# Patient Record
Sex: Male | Born: 1985 | Race: Black or African American | Hispanic: No | Marital: Single | Smoking: Former smoker
Health system: Southern US, Community
[De-identification: ages and names within clinical notes are randomized; demographics above are authoritative.]

## PROBLEM LIST (undated history)

## (undated) DIAGNOSIS — K219 Gastro-esophageal reflux disease without esophagitis: Secondary | ICD-10-CM

---

## 2015-12-06 ENCOUNTER — Encounter (HOSPITAL_BASED_OUTPATIENT_CLINIC_OR_DEPARTMENT_OTHER): Payer: Self-pay | Admitting: *Deleted

## 2015-12-06 ENCOUNTER — Emergency Department (HOSPITAL_BASED_OUTPATIENT_CLINIC_OR_DEPARTMENT_OTHER)
Admission: EM | Admit: 2015-12-06 | Discharge: 2015-12-06 | Disposition: A | Discharge status: Home / Self Care | Payer: Medicaid Other | Attending: Emergency Medicine | Admitting: Emergency Medicine

## 2015-12-06 DIAGNOSIS — J029 Acute pharyngitis, unspecified: Secondary | ICD-10-CM

## 2015-12-06 DIAGNOSIS — Z8719 Personal history of other diseases of the digestive system: Secondary | ICD-10-CM | POA: Diagnosis not present

## 2015-12-06 DIAGNOSIS — F17210 Nicotine dependence, cigarettes, uncomplicated: Secondary | ICD-10-CM | POA: Diagnosis not present

## 2015-12-06 HISTORY — DX: Gastro-esophageal reflux disease without esophagitis: K21.9

## 2015-12-06 LAB — RAPID STREP SCREEN (MED CTR MEBANE ONLY): Streptococcus, Group A Screen (Direct): NEGATIVE

## 2015-12-06 MED ORDER — BENZONATATE 100 MG PO CAPS: 100.0000 mg | ORAL_CAPSULE | Freq: Three times a day (TID) | ORAL | Status: DC | PRN

## 2015-12-06 MED ORDER — LORATADINE 10 MG PO TABS: 10.0000 mg | ORAL_TABLET | Freq: Every day | ORAL | Status: DC | PRN

## 2015-12-06 MED ORDER — IBUPROFEN 400 MG PO TABS
600.0000 mg | ORAL_TABLET | Freq: Once | ORAL | Status: AC
  Administered 2015-12-06: 600 mg via ORAL
  Filled 2015-12-06: qty 1

## 2015-12-06 NOTE — Discharge Instructions (Signed)

## 2015-12-06 NOTE — ED Notes (Signed)
Patient states he has a two day history of sore throat.  Cough started last night with white to yellow secretions. Denies fever.

## 2015-12-08 LAB — CULTURE, GROUP A STREP: Strep A Culture: NEGATIVE

## 2015-12-16 NOTE — ED Provider Notes (Signed)
CSN: [id number]     Arrival date & time 12/06/15  0807 History   First MD Initiated Contact with Patient 12/06/15 0830     Chief Complaint  Patient presents with  . Sore Throat     (Consider location/radiation/quality/duration/timing/severity/associated sxs/prior Treatment) HPI  29yM with sore throat. Onset 2 days ago. Persistent since. Cough. Productive. No fever or chills. No sob. Nor rash. No unusual leg pain or swelling. Has tried otc pain relievers with mild improvement.   Past Medical History  Diagnosis Date  . Acid reflux    History reviewed. No pertinent past surgical history. No family history on file. Social History  Substance Use Topics  . Smoking status: Current Some Day Smoker    Types: Cigars  . Smokeless tobacco: Never Used  . Alcohol Use: Yes     Comment: occassional    Review of Systems  All systems reviewed and negative, other than as noted in HPI.   Allergies  Review of patient's allergies indicates no known allergies.  Home Medications   Prior to Admission medications   Medication Sig Start Date End Date Taking? Authorizing Provider  benzonatate (TESSALON) 100 MG capsule Take 1 capsule (100 mg total) by mouth 3 (three) times daily as needed for cough. 12/06/15   Raeford RazorStephen Boston Cookson, MD  loratadine (CLARITIN) 10 MG tablet Take 1 tablet (10 mg total) by mouth daily as needed for allergies. 12/06/15   Raeford RazorStephen Kash Mothershead, MD   BP 122/85 mmHg  Pulse 75  Temp(Src) 98.5 F (36.9 C) (Oral)  Resp 20  SpO2 99% Physical Exam  Constitutional: He appears well-developed and well-nourished. No distress.  HENT:  Head: Normocephalic and atraumatic.  Mild non exudative pharyngitis. Uvula midline. Normal sounding voice. Neck supple.   Eyes: Conjunctivae are normal. Right eye exhibits no discharge. Left eye exhibits no discharge.  Neck: Neck supple.  Cardiovascular: Normal rate, regular rhythm and normal heart sounds.  Exam reveals no gallop and no friction rub.   No  murmur heard. Pulmonary/Chest: Effort normal and breath sounds normal. No respiratory distress.  Abdominal: Soft. He exhibits no distension. There is no tenderness.  Musculoskeletal: He exhibits no edema or tenderness.  Lower extremities symmetric as compared to each other. No calf tenderness. Negative Homan's. No palpable cords.   Neurological: He is alert.  Skin: Skin is warm and dry.  Psychiatric: He has a normal mood and affect. His behavior is normal. Thought content normal.  Nursing note and vitals reviewed.   ED Course  Procedures (including critical care time) Labs Review Labs Reviewed  RAPID STREP SCREEN (NOT AT [facility])  CULTURE, GROUP A STREP    Imaging Review No results found. I have personally reviewed and evaluated these images and lab results as part of my medical decision-making.   EKG Interpretation None      MDM   Final diagnoses:  Viral pharyngitis    29yM with sore throat. Likely viral illness. No evidence of airway compromise.  Clinically doubt strep. Plan symptomatic treatment.    Raeford RazorStephen Inis Borneman, MD 12/16/15 [phone]

## 2016-07-14 ENCOUNTER — Emergency Department (HOSPITAL_BASED_OUTPATIENT_CLINIC_OR_DEPARTMENT_OTHER): Payer: Medicaid Other

## 2016-07-14 ENCOUNTER — Emergency Department (HOSPITAL_BASED_OUTPATIENT_CLINIC_OR_DEPARTMENT_OTHER)
Admission: EM | Admit: 2016-07-14 | Discharge: 2016-07-14 | Disposition: A | Discharge status: Home / Self Care | Payer: Medicaid Other | Attending: Emergency Medicine | Admitting: Emergency Medicine

## 2016-07-14 ENCOUNTER — Encounter (HOSPITAL_BASED_OUTPATIENT_CLINIC_OR_DEPARTMENT_OTHER): Payer: Self-pay | Admitting: *Deleted

## 2016-07-14 DIAGNOSIS — W231XXA Caught, crushed, jammed, or pinched between stationary objects, initial encounter: Secondary | ICD-10-CM | POA: Diagnosis not present

## 2016-07-14 DIAGNOSIS — Y929 Unspecified place or not applicable: Secondary | ICD-10-CM | POA: Diagnosis not present

## 2016-07-14 DIAGNOSIS — M7989 Other specified soft tissue disorders: Secondary | ICD-10-CM | POA: Insufficient documentation

## 2016-07-14 DIAGNOSIS — M79644 Pain in right finger(s): Secondary | ICD-10-CM | POA: Diagnosis present

## 2016-07-14 DIAGNOSIS — Y999 Unspecified external cause status: Secondary | ICD-10-CM | POA: Insufficient documentation

## 2016-07-14 DIAGNOSIS — Y939 Activity, unspecified: Secondary | ICD-10-CM | POA: Diagnosis not present

## 2016-07-14 DIAGNOSIS — F17210 Nicotine dependence, cigarettes, uncomplicated: Secondary | ICD-10-CM | POA: Diagnosis not present

## 2016-07-14 DIAGNOSIS — S6991XA Unspecified injury of right wrist, hand and finger(s), initial encounter: Secondary | ICD-10-CM

## 2016-07-14 MED ORDER — IBUPROFEN 400 MG PO TABS
600.0000 mg | ORAL_TABLET | Freq: Once | ORAL | Status: AC | PRN
  Administered 2016-07-14: 600 mg via ORAL
  Filled 2016-07-14: qty 1

## 2016-07-14 MED ORDER — IBUPROFEN 800 MG PO TABS: 800.0000 mg | ORAL_TABLET | Freq: Three times a day (TID) | ORAL | 0 refills | Status: DC

## 2016-07-14 NOTE — ED Triage Notes (Signed)
Right hand index finger swelling since yesterday, unsure of injury

## 2016-07-14 NOTE — ED Provider Notes (Signed)
[facility]   CSN: [id number] Arrival date & time: 07/14/16  1645  First Provider Contact:  First MD Initiated Contact with Patient 07/14/16 2005        History   Chief Complaint Chief Complaint  Patient presents with  . Finger Injury    HPI Edgar Arellano is a 30 y.o. male.  HPI Edgar Arellano is a 30 y.o. male with PMH significant for acid reflux who presents with 3 months of sudden onset, intermittent, mild-to-moderate right Ring finger pain after he jammed it. Patient reports it will intermittently become more painful and will swell. He states it flared up on him last night. No prior treatments. Aggravating factors include movement. No modifying factors. He denies any color changes, fever, chills on and numbness, or weakness.  Past Medical History:  Diagnosis Date  . Acid reflux     There are no active problems to display for this patient.   History reviewed. No pertinent surgical history.     Home Medications    Prior to Admission medications   Medication Sig Start Date End Date Taking? Authorizing Provider  benzonatate (TESSALON) 100 MG capsule Take 1 capsule (100 mg total) by mouth 3 (three) times daily as needed for cough. 12/06/15   Raeford Razor, MD  ibuprofen (ADVIL,MOTRIN) 800 MG tablet Take 1 tablet (800 mg total) by mouth 3 (three) times daily. 07/14/16   Cheri Fowler, PA-C  loratadine (CLARITIN) 10 MG tablet Take 1 tablet (10 mg total) by mouth daily as needed for allergies. 12/06/15   Raeford Razor, MD    Family History No family history on file.  Social History Social History  Substance Use Topics  . Smoking status: Current Some Day Smoker    Types: Cigars  . Smokeless tobacco: Never Used  . Alcohol use Yes     Comment: occassional     Allergies   Review of patient's allergies indicates no known allergies.   Review of Systems Review of Systems All other systems negative unless otherwise stated in  HPI   Physical Exam Updated Vital Signs BP 132/88 (BP Location: Left Arm)   Pulse 77   Temp 98.6 F (37 C) (Oral)   Resp 16   Ht 5\' 5"  (1.651 m)   Wt 60.8 kg   SpO2 97%   BMI 22.30 kg/m   Physical Exam  Constitutional: He is oriented to person, place, and time. He appears well-developed and well-nourished.  HENT:  Head: Normocephalic and atraumatic.  Eyes: Conjunctivae are normal.  Neck: Normal range of motion.  Cardiovascular:  Pulses:      Radial pulses are 2+ on the right side, and 2+ on the left side.  Brisk capillary refill.  Pulmonary/Chest: Effort normal. No respiratory distress.  Abdominal: He exhibits no distension.  Musculoskeletal: He exhibits edema and tenderness.  Mild-to-moderate swelling over the right fourth PIPJ with tenderness. Slightly decreased range of motion of this joint with flexion due to pain.  Neurological: He is alert and oriented to person, place, and time.  Strength and sensation intact in all digits.  Skin: Skin is warm and dry. Capillary refill takes less than 2 seconds. No erythema.  No erythema, warmth, induration, or fluctuance.     ED Treatments / Results  Labs (all labs ordered are listed, but only abnormal results are displayed) Labs Reviewed - No data to display  EKG  EKG Interpretation None       Radiology Dg Finger Ring Right  Result  Date: 07/14/2016 CLINICAL DATA:  Pain after trauma 3 months ago. EXAM: RIGHT RING FINGER 2+V COMPARISON:  None. FINDINGS: There is no evidence of fracture or dislocation. There is no evidence of arthropathy or other focal bone abnormality. Soft tissues are unremarkable. IMPRESSION: Negative. Electronically Signed   By: Gerome Sam III M.D   On: 07/14/2016 17:58    Procedures Procedures (including critical care time)  Medications Ordered in ED Medications  ibuprofen (ADVIL,MOTRIN) tablet 600 mg (600 mg Oral Given 07/14/16 2017)     Initial Impression / Assessment and Plan / ED Course   I have reviewed the triage vital signs and the nursing notes.  Pertinent labs & imaging results that were available during my care of the patient were reviewed by me and considered in my medical decision making (see chart for details).  Clinical Course   Patient presents with intermittent right Ring finger pain after jamming it. He is neurovascularly intact with brisk capillary refill. No infectious signs.  Plain films are negative for fracture. Patient placed in finger splint. Home with ibuprofen. Recommend ice. Follow up hand surgery. Return precautions discussed. Patient agrees and acknowledges the above plan for discharge.   Final Clinical Impressions(s) / ED Diagnoses   Final diagnoses:  Jammed finger (interphalangeal joint), right, initial encounter    New Prescriptions New Prescriptions   IBUPROFEN (ADVIL,MOTRIN) 800 MG TABLET    Take 1 tablet (800 mg total) by mouth 3 (three) times daily.     Cheri Fowler, PA-C 07/14/16 2035    Loren Racer, MD 07/17/16 2242

## 2016-08-28 ENCOUNTER — Other Ambulatory Visit: Payer: Self-pay | Admitting: Orthopaedic Surgery

## 2016-08-28 DIAGNOSIS — M79641 Pain in right hand: Secondary | ICD-10-CM

## 2016-09-05 ENCOUNTER — Ambulatory Visit
Admission: RE | Admit: 2016-09-05 | Discharge: 2016-09-05 | Disposition: A | Discharge status: Home / Self Care | Payer: Medicaid Other | Source: Ambulatory Visit | Attending: Orthopaedic Surgery | Admitting: Orthopaedic Surgery

## 2016-09-05 DIAGNOSIS — M79641 Pain in right hand: Secondary | ICD-10-CM

## 2016-09-13 ENCOUNTER — Ambulatory Visit (INDEPENDENT_AMBULATORY_CARE_PROVIDER_SITE_OTHER): Payer: Medicaid Other | Admitting: Orthopaedic Surgery

## 2016-09-13 DIAGNOSIS — S63634D Sprain of interphalangeal joint of right ring finger, subsequent encounter: Secondary | ICD-10-CM

## 2016-10-15 ENCOUNTER — Other Ambulatory Visit (INDEPENDENT_AMBULATORY_CARE_PROVIDER_SITE_OTHER): Payer: Self-pay | Admitting: Orthopaedic Surgery

## 2016-10-15 ENCOUNTER — Ambulatory Visit (INDEPENDENT_AMBULATORY_CARE_PROVIDER_SITE_OTHER): Payer: Medicaid Other | Admitting: Orthopaedic Surgery

## 2016-10-15 DIAGNOSIS — M79644 Pain in right finger(s): Secondary | ICD-10-CM | POA: Diagnosis not present

## 2016-10-15 MED ORDER — METHYLPREDNISOLONE 4 MG PO TBPK: ORAL_TABLET | ORAL | 0 refills | Status: DC

## 2016-10-15 MED ORDER — METHYLPREDNISOLONE 4 MG PO TABS: ORAL_TABLET | ORAL | 0 refills | Status: DC

## 2016-10-15 MED ORDER — METHYLPREDNISOLONE 4 MG PO TABS: 4.0000 mg | ORAL_TABLET | Freq: Every day | ORAL | 0 refills | Status: DC

## 2016-10-15 MED ORDER — DICLOFENAC SODIUM 1 % TD GEL: 2.0000 g | Freq: Three times a day (TID) | TRANSDERMAL | Status: DC | PRN

## 2016-10-15 MED ORDER — DICLOFENAC SODIUM 1 % TD GEL: 2.0000 g | Freq: Four times a day (QID) | TRANSDERMAL | 3 refills | Status: DC

## 2016-10-15 NOTE — Progress Notes (Signed)
Mr. 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 We obtained an MRI of his right hand that showed no ligamentous disruption or fracture of the ring finger. This is been going on since an accident earlier this year. He sustained trauma to this finger does not minimally get better. I did provide an injection around the flexor tendons at his last visit a month ago. He said that did help some but he still has some pain.  On examination of his right hand and his swelling is dramatically decreased he has full range of motion of his MCP and IP joints of his fingers including the right ring finger but is deathly still painful.  At this point no other thing that I would try as one more oral steroid taper as well as Voltaren gel. Follow up as needed.

## 2017-03-11 ENCOUNTER — Encounter (HOSPITAL_BASED_OUTPATIENT_CLINIC_OR_DEPARTMENT_OTHER): Payer: Self-pay

## 2017-03-11 ENCOUNTER — Emergency Department (HOSPITAL_BASED_OUTPATIENT_CLINIC_OR_DEPARTMENT_OTHER)
Admission: EM | Admit: 2017-03-11 | Discharge: 2017-03-11 | Disposition: A | Discharge status: Home / Self Care | Payer: Medicaid Other | Attending: Emergency Medicine | Admitting: Emergency Medicine

## 2017-03-11 DIAGNOSIS — F17290 Nicotine dependence, other tobacco product, uncomplicated: Secondary | ICD-10-CM | POA: Insufficient documentation

## 2017-03-11 DIAGNOSIS — K120 Recurrent oral aphthae: Secondary | ICD-10-CM | POA: Insufficient documentation

## 2017-03-11 DIAGNOSIS — K1379 Other lesions of oral mucosa: Secondary | ICD-10-CM | POA: Diagnosis present

## 2017-03-11 MED ORDER — IBUPROFEN 400 MG PO TABS
600.0000 mg | ORAL_TABLET | Freq: Once | ORAL | Status: AC
  Administered 2017-03-11: 600 mg via ORAL
  Filled 2017-03-11: qty 1

## 2017-03-11 NOTE — ED Notes (Signed)
Pain to roof of mouth, reddened area noted and is "breaking out" to face and not sure why.

## 2017-03-11 NOTE — ED Triage Notes (Signed)
c/o woke with pain to mouth x today-denies injury-NAD-steady gait

## 2017-03-11 NOTE — Discharge Instructions (Signed)
Please read instructions below. You can gargle with warm salt water to help symptoms. You can take Advil or Tylenol for pain. Return to ER for inability to swallow liquids, sore throat, fever, new or worsening symptoms.

## 2017-03-11 NOTE — ED Provider Notes (Signed)
[facility]   CSN: [id number] Arrival date & time: 03/11/17  1533   By signing my name below, I, Talbert Nan, attest that this documentation has been prepared under the direction and in the presence of [place] Russo, PA-C. Electronically Signed: Talbert Nan, Scribe. 03/11/17. 4:49 PM.    History   Chief Complaint Chief Complaint  Patient presents with  . Oral Pain    HPI Edgar Arellano is a 31 y.o. male who presents to the Emergency Department complaining of a sore in his mouth that began this morning. Denies recent temperature hot food, but reports eating hot sauce and pork skin last night which is reportedly a "tough" food. Denies viral sx, F/C, nasal congestion. He is able to swallow liquids without difficulty, though it is painful. States he gargled salt water once PTA without relief.     The history is provided by the patient. No language interpreter was used.    Past Medical History:  Diagnosis Date  . Acid reflux     There are no active problems to display for this patient.   History reviewed. No pertinent surgical history.     Home Medications    Prior to Admission medications   Not on File    Family History No family history on file.  Social History Social History  Substance Use Topics  . Smoking status: Current Some Day Smoker    Types: Cigars  . Smokeless tobacco: Never Used  . Alcohol use Yes     Comment: weekly     Allergies   Patient has no known allergies.   Review of Systems Review of Systems  Constitutional: Negative for chills and fever.  HENT: Positive for mouth sores. Negative for congestion, dental problem, sinus pressure, sore throat and trouble swallowing.   Respiratory: Negative for cough.   Musculoskeletal: Negative for neck pain.  Skin:       Dry skin on face  Hematological: Negative for adenopathy.  All other systems reviewed and are negative.    Physical Exam Updated Vital Signs BP  136/85 (BP Location: Left Arm)   Pulse 64   Temp 98.2 F (36.8 C) (Oral)   Resp 16   Ht  (1.651 m)   Wt 63 kg   SpO2 100%   BMI 23.13 kg/m   Physical Exam  Constitutional: He is oriented to person, place, and time. He appears well-developed and well-nourished.  HENT:  Head: Normocephalic and atraumatic.  Mouth/Throat: Uvula is midline and mucous membranes are normal. Oral lesions (<69mm white lesion w surrounding erythema on soft L soft palate) present. No trismus in the jaw. No dental abscesses or uvula swelling. No oropharyngeal exudate, posterior oropharyngeal erythema or tonsillar abscesses. No tonsillar exudate.    Eyes: Conjunctivae are normal.  Neck: Normal range of motion. Neck supple.  Cardiovascular: Normal rate.   Pulmonary/Chest: Effort normal. No respiratory distress.  Musculoskeletal: Normal range of motion.  Lymphadenopathy:    He has no cervical adenopathy.  Neurological: He is alert and oriented to person, place, and time.  Skin: Skin is warm and dry. No rash noted.  Psychiatric: He has a normal mood and affect. His behavior is normal.  Nursing note and vitals reviewed.    ED Treatments / Results   DIAGNOSTIC STUDIES: Oxygen Saturation is 100% on room air, normal by my interpretation.    COORDINATION OF CARE: 4:40 PM Discussed treatment plan with pt at bedside and pt agreed to plan, which includes  Tylenol or Advil for pain, salt water to gargle. If his pain increases or he begins to not be able to swallow he needs to come back to the ED.    Labs (all labs ordered are listed, but only abnormal results are displayed) Labs Reviewed - No data to display  EKG  EKG Interpretation None       Radiology No results found.  Procedures Procedures (including critical care time)  Medications Ordered in ED Medications  ibuprofen (ADVIL,MOTRIN) tablet 600 mg (600 mg Oral Given 03/11/17 1706)     Initial Impression / Assessment and Plan / ED Course    I have reviewed the triage vital signs and the nursing notes.  Pertinent labs & imaging results that were available during my care of the patient were reviewed by me and considered in my medical decision making (see chart for details).     Pt w likely apthous ulcer on soft palate, initial occurrence. No viral sx, able to swallow liquids w/o difficulty, uvula midline, no trismus, afebrile, no adenopathy. Apthous ulcer vs oral HSV vs mucosal abrasion. Pt safe for discharge. Warm salt water gargle, advil or tylenol for pain, avoid spicy foods.   Discussed results, findings, treatment and follow up. Patient advised of return precautions. Patient verbalized understanding and agreed with plan.   Final Clinical Impressions(s) / ED Diagnoses   Final diagnoses:  Oral aphthous ulcer    New Prescriptions There are no discharge medications for this patient.  I personally performed the services described in this documentation, which was scribed in my presence. The recorded information has been reviewed and is accurate.     [place] N Russo, PA-C 03/12/17 2440    Lyndal Pulley, MD 03/12/17 [phone]

## 2017-07-28 ENCOUNTER — Emergency Department (HOSPITAL_BASED_OUTPATIENT_CLINIC_OR_DEPARTMENT_OTHER)
Admission: EM | Admit: 2017-07-28 | Discharge: 2017-07-29 | Disposition: A | Discharge status: Home / Self Care | Payer: Medicaid Other | Attending: Emergency Medicine | Admitting: Emergency Medicine

## 2017-07-28 ENCOUNTER — Emergency Department (HOSPITAL_BASED_OUTPATIENT_CLINIC_OR_DEPARTMENT_OTHER): Payer: Medicaid Other

## 2017-07-28 ENCOUNTER — Encounter (HOSPITAL_BASED_OUTPATIENT_CLINIC_OR_DEPARTMENT_OTHER): Payer: Self-pay | Admitting: Emergency Medicine

## 2017-07-28 DIAGNOSIS — K5900 Constipation, unspecified: Secondary | ICD-10-CM

## 2017-07-28 DIAGNOSIS — F17210 Nicotine dependence, cigarettes, uncomplicated: Secondary | ICD-10-CM | POA: Insufficient documentation

## 2017-07-28 DIAGNOSIS — R1011 Right upper quadrant pain: Secondary | ICD-10-CM

## 2017-07-28 DIAGNOSIS — K649 Unspecified hemorrhoids: Secondary | ICD-10-CM | POA: Diagnosis not present

## 2017-07-28 DIAGNOSIS — R112 Nausea with vomiting, unspecified: Secondary | ICD-10-CM | POA: Diagnosis not present

## 2017-07-28 LAB — CBC WITH DIFFERENTIAL/PLATELET
BASOS ABS: 0.1 10*3/uL (ref 0.0–0.1)
BASOS PCT: 1 %
EOS PCT: 5 %
Eosinophils Absolute: 0.3 10*3/uL (ref 0.0–0.7)
HCT: 41 % (ref 39.0–52.0)
Hemoglobin: 14.4 g/dL (ref 13.0–17.0)
Lymphocytes Relative: 46 %
Lymphs Abs: 2.6 10*3/uL (ref 0.7–4.0)
MCH: 28.9 pg (ref 26.0–34.0)
MCHC: 35.1 g/dL (ref 30.0–36.0)
MCV: 82.2 fL (ref 78.0–100.0)
Monocytes Absolute: 0.6 10*3/uL (ref 0.1–1.0)
Monocytes Relative: 10 %
NEUTROS PCT: 38 %
Neutro Abs: 2.2 10*3/uL (ref 1.7–7.7)
Platelets: 301 10*3/uL (ref 150–400)
RBC: 4.99 MIL/uL (ref 4.22–5.81)
RDW: 13.7 % (ref 11.5–15.5)
WBC: 5.7 10*3/uL (ref 4.0–10.5)

## 2017-07-28 LAB — COMPREHENSIVE METABOLIC PANEL
ALT: 22 U/L (ref 17–63)
AST: 30 U/L (ref 15–41)
Albumin: 4.4 g/dL (ref 3.5–5.0)
Alkaline Phosphatase: 55 U/L (ref 38–126)
Anion gap: 10 (ref 5–15)
BILIRUBIN TOTAL: 0.6 mg/dL (ref 0.3–1.2)
BUN: 18 mg/dL (ref 6–20)
CO2: 25 mmol/L (ref 22–32)
Calcium: 9.6 mg/dL (ref 8.9–10.3)
Chloride: 104 mmol/L (ref 101–111)
Creatinine, Ser: 1.14 mg/dL (ref 0.61–1.24)
Glucose, Bld: 102 mg/dL — ABNORMAL HIGH (ref 65–99)
POTASSIUM: 3.5 mmol/L (ref 3.5–5.1)
Sodium: 139 mmol/L (ref 135–145)
Total Protein: 7.4 g/dL (ref 6.5–8.1)

## 2017-07-28 LAB — LIPASE, BLOOD: Lipase: 57 U/L — ABNORMAL HIGH (ref 11–51)

## 2017-07-28 MED ORDER — MORPHINE SULFATE (PF) 4 MG/ML IV SOLN
4.0000 mg | Freq: Once | INTRAVENOUS | Status: AC
  Administered 2017-07-28: 4 mg via INTRAVENOUS
  Filled 2017-07-28: qty 1

## 2017-07-28 MED ORDER — ONDANSETRON HCL 4 MG/2ML IJ SOLN
4.0000 mg | Freq: Once | INTRAMUSCULAR | Status: AC
  Administered 2017-07-28: 4 mg via INTRAVENOUS
  Filled 2017-07-28: qty 2

## 2017-07-28 NOTE — ED Triage Notes (Signed)
Patient is having right upper quadrant pain all day with Nausea. Reports that he vomited x 1 prior to arrival. Patient also reports Hemorid problems

## 2017-07-28 NOTE — ED Provider Notes (Signed)
[facility]   CSN: [id number] Arrival date & time: 07/28/17  2136  By signing my name below, I, Diona Browner, attest that this documentation has been prepared under the direction and in the presence of Fredricka Kohrs, Canary Brim, MD. Electronically Signed: Diona Browner, ED Scribe. 07/28/17. 11:03 PM.  History   Chief Complaint Chief Complaint  Patient presents with  . Abdominal Pain    HPI Edgar Arellano is a 31 y.o. male who presents to the Emergency Department complaining of constant RUQ pain that started this evening. Associated sx include nausea, vomiting, and constipation. Pt also reports hemorrhoids and has noticed BRB on the toilet paper. He has been constipated for the last week. Pt had medication for his constipation, but ran out of it. He hasn't tried anything else to alleviate his sx. Pt denies appetite changes, fever, or any other complaints at this time.   The history is provided by the patient. No language interpreter was used.    Past Medical History:  Diagnosis Date  . Acid reflux     There are no active problems to display for this patient.   History reviewed. No pertinent surgical history.     Home Medications    Prior to Admission medications   Medication Sig Start Date End Date Taking? Authorizing Provider  docusate sodium (COLACE) 100 MG capsule Take 1 capsule (100 mg total) by mouth every 12 (twelve) hours. 07/29/17   Gilda Crease, MD  hydrocortisone (ANUSOL-HC) 25 MG suppository Place 1 suppository (25 mg total) rectally 2 (two) times daily. 07/29/17   Gilda Crease, MD  magnesium citrate SOLN Take 296 mLs (1 Bottle total) by mouth once. 07/29/17 07/29/17  Gilda Crease, MD  polyethylene glycol (MIRALAX / GLYCOLAX) packet Take 17 g by mouth daily as needed for moderate constipation. 07/29/17   Gilda Crease, MD  ranitidine (ZANTAC) 150 MG tablet Take 1 tablet (150 mg total) by mouth 2  (two) times daily. 07/29/17   Gilda Crease, MD    Family History History reviewed. No pertinent family history.  Social History Social History  Substance Use Topics  . Smoking status: Current Some Day Smoker    Types: Cigars  . Smokeless tobacco: Never Used  . Alcohol use Yes     Comment: weekly     Allergies   Patient has no known allergies.   Review of Systems Review of Systems  Constitutional: Negative for appetite change and fever.  Gastrointestinal: Positive for abdominal pain, constipation, nausea and vomiting.  All other systems reviewed and are negative.    Physical Exam Updated Vital Signs BP (!) 139/95   Pulse 74   Temp 98.6 F (37 C) (Oral)   Resp 15   Ht 5\' 5"  (1.651 m)   Wt 56.7 kg (125 lb)   SpO2 99%   BMI 20.80 kg/m   Physical Exam  Constitutional: He is oriented to person, place, and time. He appears well-developed and well-nourished. No distress.  HENT:  Head: Normocephalic and atraumatic.  Right Ear: Hearing normal.  Left Ear: Hearing normal.  Nose: Nose normal.  Mouth/Throat: Oropharynx is clear and moist and mucous membranes are normal.  Eyes: Pupils are equal, round, and reactive to light. Conjunctivae and EOM are normal.  Neck: Normal range of motion. Neck supple.  Cardiovascular: Regular rhythm, S1 normal and S2 normal.  Exam reveals no gallop and no friction rub.   No murmur heard. Pulmonary/Chest: Effort normal and breath sounds  normal. No respiratory distress. He exhibits no tenderness.  Abdominal: Soft. Normal appearance and bowel sounds are normal. There is no hepatosplenomegaly. There is tenderness in the right upper quadrant. There is no rebound, no guarding, no tenderness at McBurney's point and negative Murphy's sign. No hernia.  Musculoskeletal: Normal range of motion.  Neurological: He is alert and oriented to person, place, and time. He has normal strength. No cranial nerve deficit or sensory deficit. Coordination  normal. GCS eye subscore is 4. GCS verbal subscore is 5. GCS motor subscore is 6.  Skin: Skin is warm, dry and intact. No rash noted. No cyanosis.  Psychiatric: He has a normal mood and affect. His speech is normal and behavior is normal. Thought content normal.  Nursing note and vitals reviewed.    ED Treatments / Results  DIAGNOSTIC STUDIES: Oxygen Saturation is 100% on RA, normal by my interpretation.   COORDINATION OF CARE: 11:03 PM-Discussed next steps with pt. Pt verbalized understanding and is agreeable with the plan.   Labs (all labs ordered are listed, but only abnormal results are displayed) Labs Reviewed  COMPREHENSIVE METABOLIC PANEL - Abnormal; Notable for the following:       Result Value   Glucose, Bld 102 (*)    All other components within normal limits  LIPASE, BLOOD - Abnormal; Notable for the following:    Lipase 57 (*)    All other components within normal limits  CBC WITH DIFFERENTIAL/PLATELET    EKG  EKG Interpretation None       Radiology Dg Abd Acute W/chest  Result Date: 07/28/2017 CLINICAL DATA:  Right-sided abdominal pain.  Vomiting. EXAM: DG ABDOMEN ACUTE W/ 1V CHEST COMPARISON:  None. FINDINGS: There is no evidence of dilated bowel loops or free intraperitoneal air. No radiopaque calculi or other significant radiographic abnormality is seen. Heart size and mediastinal contours are within normal limits. Both lungs are clear. IMPRESSION: Negative abdominal radiographs.  No acute cardiopulmonary disease. Electronically Signed   By: Ted Mcalpine M.D.   On: 07/28/2017 23:40    Procedures Procedures (including critical care time)  Medications Ordered in ED Medications  morphine 4 MG/ML injection 4 mg (4 mg Intravenous Given 07/28/17 2334)  ondansetron (ZOFRAN) injection 4 mg (4 mg Intravenous Given 07/28/17 2334)     Initial Impression / Assessment and Plan / ED Course  I have reviewed the triage vital signs and the nursing  notes.  Pertinent labs & imaging results that were available during my care of the patient were reviewed by me and considered in my medical decision making (see chart for details).     Patient presents to the emergency department for evaluation of abdominal pain. Patient complaining of persistent abdominal discomfort, does not have any signs of peritonitis on examination. He is afebrile. Vital signs are normal. White blood cell count is 5.7. Blood work is unremarkable. Patient reports a history of acid reflux and constipation. He has been feeling increased constipation and experiencing hemorrhoids this week. X-ray does show increased stool burden, predominantly in the right upper quadrant that explains his symptoms. Does not require any additional imaging. Will treat for hemorrhoids, constipation and reflux, follow-up with gastroenterology.  Final Clinical Impressions(s) / ED Diagnoses   Final diagnoses:  Right upper quadrant abdominal pain  Constipation, unspecified constipation type  Hemorrhoids, unspecified hemorrhoid type    New Prescriptions New Prescriptions   DOCUSATE SODIUM (COLACE) 100 MG CAPSULE    Take 1 capsule (100 mg total) by mouth every 12 (  twelve) hours.   HYDROCORTISONE (ANUSOL-HC) 25 MG SUPPOSITORY    Place 1 suppository (25 mg total) rectally 2 (two) times daily.   MAGNESIUM CITRATE SOLN    Take 296 mLs (1 Bottle total) by mouth once.   POLYETHYLENE GLYCOL (MIRALAX / GLYCOLAX) PACKET    Take 17 g by mouth daily as needed for moderate constipation.   RANITIDINE (ZANTAC) 150 MG TABLET    Take 1 tablet (150 mg total) by mouth 2 (two) times daily.   I personally performed the services described in this documentation, which was scribed in my presence. The recorded information has been reviewed and is accurate.'     Gilda Crease, MD 07/29/17 0025

## 2017-07-28 NOTE — ED Notes (Signed)
Alert, NAD, calm, interactive, resps e/u, speaking in clear complete sentences, no dyspnea noted, skin W&D, VSS, c/o abd pain and NVD, constipation, (denies: sob, dizziness or visual changes), EDP into room. Family at [facility].

## 2017-07-29 MED ORDER — POLYETHYLENE GLYCOL 3350 17 G PO PACK: 17.0000 g | PACK | Freq: Every day | ORAL | 0 refills | Status: AC | PRN

## 2017-07-29 MED ORDER — DOCUSATE SODIUM 100 MG PO CAPS: 100.0000 mg | ORAL_CAPSULE | Freq: Two times a day (BID) | ORAL | 0 refills | Status: AC

## 2017-07-29 MED ORDER — RANITIDINE HCL 150 MG PO TABS: 150.0000 mg | ORAL_TABLET | Freq: Two times a day (BID) | ORAL | 0 refills | Status: AC

## 2017-07-29 MED ORDER — MAGNESIUM CITRATE PO SOLN: 1.0000 | Freq: Once | ORAL | 0 refills | Status: AC

## 2017-07-29 MED ORDER — ONDANSETRON HCL 4 MG/2ML IJ SOLN
4.0000 mg | Freq: Once | INTRAMUSCULAR | Status: AC
  Administered 2017-07-29: 4 mg via INTRAVENOUS

## 2017-07-29 MED ORDER — ONDANSETRON HCL 4 MG/2ML IJ SOLN
INTRAMUSCULAR | Status: AC
  Administered 2017-07-29: 4 mg via INTRAVENOUS
  Filled 2017-07-29: qty 2

## 2017-07-29 MED ORDER — HYDROCORTISONE ACETATE 25 MG RE SUPP: 25.0000 mg | Freq: Two times a day (BID) | RECTAL | 0 refills | Status: AC

## 2017-12-27 ENCOUNTER — Emergency Department (HOSPITAL_BASED_OUTPATIENT_CLINIC_OR_DEPARTMENT_OTHER): Payer: Medicaid Other

## 2017-12-27 ENCOUNTER — Encounter (HOSPITAL_BASED_OUTPATIENT_CLINIC_OR_DEPARTMENT_OTHER): Payer: Self-pay | Admitting: Emergency Medicine

## 2017-12-27 ENCOUNTER — Emergency Department (HOSPITAL_BASED_OUTPATIENT_CLINIC_OR_DEPARTMENT_OTHER)
Admission: EM | Admit: 2017-12-27 | Discharge: 2017-12-27 | Disposition: A | Discharge status: Home / Self Care | Payer: Medicaid Other | Attending: Emergency Medicine | Admitting: Emergency Medicine

## 2017-12-27 ENCOUNTER — Other Ambulatory Visit: Payer: Self-pay

## 2017-12-27 DIAGNOSIS — R112 Nausea with vomiting, unspecified: Secondary | ICD-10-CM

## 2017-12-27 DIAGNOSIS — Z79899 Other long term (current) drug therapy: Secondary | ICD-10-CM | POA: Diagnosis not present

## 2017-12-27 DIAGNOSIS — R197 Diarrhea, unspecified: Secondary | ICD-10-CM

## 2017-12-27 DIAGNOSIS — Z87891 Personal history of nicotine dependence: Secondary | ICD-10-CM | POA: Insufficient documentation

## 2017-12-27 DIAGNOSIS — E860 Dehydration: Secondary | ICD-10-CM | POA: Diagnosis not present

## 2017-12-27 LAB — COMPREHENSIVE METABOLIC PANEL
ALBUMIN: 4.2 g/dL (ref 3.5–5.0)
ALK PHOS: 58 U/L (ref 38–126)
ALT: 16 U/L — ABNORMAL LOW (ref 17–63)
ANION GAP: 11 (ref 5–15)
AST: 28 U/L (ref 15–41)
BILIRUBIN TOTAL: 0.6 mg/dL (ref 0.3–1.2)
BUN: 17 mg/dL (ref 6–20)
CALCIUM: 9.1 mg/dL (ref 8.9–10.3)
CO2: 25 mmol/L (ref 22–32)
Chloride: 103 mmol/L (ref 101–111)
Creatinine, Ser: 1.62 mg/dL — ABNORMAL HIGH (ref 0.61–1.24)
GFR, EST NON AFRICAN AMERICAN: 55 mL/min — AB (ref >60)
GLUCOSE: 95 mg/dL (ref 65–99)
Potassium: 3.9 mmol/L (ref 3.5–5.1)
Sodium: 139 mmol/L (ref 135–145)
TOTAL PROTEIN: 8.2 g/dL — AB (ref 6.5–8.1)

## 2017-12-27 LAB — CBC
HEMATOCRIT: 45.7 % (ref 39.0–52.0)
HEMOGLOBIN: 15.8 g/dL (ref 13.0–17.0)
MCH: 28.5 pg (ref 26.0–34.0)
MCHC: 34.6 g/dL (ref 30.0–36.0)
MCV: 82.3 fL (ref 78.0–100.0)
Platelets: 231 10*3/uL (ref 150–400)
RBC: 5.55 MIL/uL (ref 4.22–5.81)
RDW: 13.6 % (ref 11.5–15.5)
WBC: 4.4 10*3/uL (ref 4.0–10.5)

## 2017-12-27 LAB — INFLUENZA PANEL BY PCR (TYPE A & B)
Influenza A By PCR: POSITIVE — AB
Influenza B By PCR: NEGATIVE

## 2017-12-27 LAB — LIPASE, BLOOD: LIPASE: 56 U/L — AB (ref 11–51)

## 2017-12-27 MED ORDER — KETOROLAC TROMETHAMINE 30 MG/ML IJ SOLN
30.0000 mg | Freq: Once | INTRAMUSCULAR | Status: AC
  Administered 2017-12-27: 30 mg via INTRAVENOUS
  Filled 2017-12-27: qty 1

## 2017-12-27 MED ORDER — ONDANSETRON 8 MG PO TBDP: 8.0000 mg | ORAL_TABLET | Freq: Three times a day (TID) | ORAL | 0 refills | Status: AC | PRN

## 2017-12-27 MED ORDER — ONDANSETRON HCL 4 MG/2ML IJ SOLN
4.0000 mg | Freq: Once | INTRAMUSCULAR | Status: AC
Start: 2017-12-27 — End: 2017-12-27
  Administered 2017-12-27: 4 mg via INTRAVENOUS
  Filled 2017-12-27: qty 2

## 2017-12-27 MED ORDER — SODIUM CHLORIDE 0.9 % IV BOLUS (SEPSIS)
2000.0000 mL | Freq: Once | INTRAVENOUS | Status: AC
  Administered 2017-12-27: 2000 mL via INTRAVENOUS

## 2017-12-27 NOTE — ED Provider Notes (Signed)
[facility] EMERGENCY DEPARTMENT Provider Note   CSN: [id number] Arrival date & time: 12/27/17  [id number]     History   Chief Complaint Chief Complaint  Patient presents with  . flu-like symptoms    HPI Edgar Arellano is a 32 y.o. male.  HPI Patient is a 32 year old male presents the emergency department with nausea vomiting diarrhea headache and myalgias over the past 3 days.  He has had cough without significant shortness of breath.  He had a fever to 103.9 yesterday.  No recent sick contacts.  Denies focal abdominal pain.  Denies blood in his stool or vomit.  Feels weak and dehydrated.  Symptoms are moderate in severity   Past Medical History:  Diagnosis Date  . Acid reflux     There are no active problems to display for this patient.   History reviewed. No pertinent surgical history.     Home Medications    Prior to Admission medications   Medication Sig Start Date End Date Taking? Authorizing Provider  docusate sodium (COLACE) 100 MG capsule Take 1 capsule (100 mg total) by mouth every 12 (twelve) hours. 07/29/17   Gilda CreasePollina, Christopher J, MD  hydrocortisone (ANUSOL-HC) 25 MG suppository Place 1 suppository (25 mg total) rectally 2 (two) times daily. 07/29/17   Gilda CreasePollina, Christopher J, MD  ondansetron (ZOFRAN ODT) 8 MG disintegrating tablet Take 1 tablet (8 mg total) by mouth every 8 (eight) hours as needed for nausea or vomiting. 12/27/17   Azalia Bilisampos, Lavere Stork, MD  polyethylene glycol [facility](MIRALAX / Ethelene HalGLYCOLAX) packet Take 17 g by mouth daily as needed for moderate constipation. 07/29/17   Gilda CreasePollina, Christopher J, MD  ranitidine (ZANTAC) 150 MG tablet Take 1 tablet (150 mg total) by mouth 2 (two) times daily. 07/29/17   Gilda CreasePollina, Christopher J, MD    Family History No family history on file.  Social History Social History   Tobacco Use  . Smoking status: Former Smoker    Types: Cigars  . Smokeless tobacco: Never Used  Substance Use Topics  . Alcohol use: Yes   Comment: weekly  . Drug use: No     Allergies   Patient has no known allergies.   Review of Systems Review of Systems  All other systems reviewed and are negative.    Physical Exam Updated Vital Signs BP 116/67 (BP Location: Left Arm)   Pulse 79   Temp 98.4 F (36.9 C) (Oral)   Resp 16   Ht 5\' 5"  (1.651 m)   Wt 68 kg (150 lb)   SpO2 100%   BMI 24.96 kg/m   Physical Exam  Constitutional: He is oriented to person, place, and time.  HENT:  Head: Normocephalic and atraumatic.  Dry mucous membranes  Eyes: EOM are normal.  Neck: Normal range of motion.  Cardiovascular: Regular rhythm and intact distal pulses.  Tachycardic  Pulmonary/Chest: Effort normal and breath sounds normal. No respiratory distress.  Abdominal: He exhibits no distension. There is no tenderness.  Musculoskeletal: Normal range of motion.  Neurological: He is alert and oriented to person, place, and time.  Skin: Skin is warm and dry.  Nursing note and vitals reviewed.    ED Treatments / Results  Labs (all labs ordered are listed, but only abnormal results are displayed) Labs Reviewed  COMPREHENSIVE METABOLIC PANEL - Abnormal; Notable for the following components:      Result Value   Creatinine, Ser 1.62 (*)    Total Protein 8.2 (*)    ALT 16 (*)  GFR calc non Af Amer 55 (*)    All other components within normal limits  LIPASE, BLOOD - Abnormal; Notable for the following components:   Lipase 56 (*)    All other components within normal limits  CBC  INFLUENZA PANEL BY PCR (TYPE A & B)    EKG  EKG Interpretation None       Radiology Dg Chest 2 View  Result Date: 12/27/2017 CLINICAL DATA:  Fever, vomiting, diarrhea, headache and body aches. Cough. Three days duration. EXAM: CHEST  2 VIEW COMPARISON:  07/28/2017 FINDINGS: Heart size is normal. Mediastinal shadows are normal. The lungs are clear. No bronchial thickening. No infiltrate, mass, effusion or collapse. Pulmonary vascularity  is normal. No bony abnormality. IMPRESSION: Normal chest Electronically Signed   By: Paulina Fusi M.D.   On: 12/27/2017 10:32    Procedures Procedures (including critical care time)  Medications Ordered in ED Medications  sodium chloride 0.9 % bolus 2,000 mL (0 mLs Intravenous Stopped 12/27/17 1200)  ondansetron (ZOFRAN) injection 4 mg (4 mg Intravenous Given 12/27/17 1014)  ketorolac (TORADOL) 30 MG/ML injection 30 mg (30 mg Intravenous Given 12/27/17 1014)     Initial Impression / Assessment and Plan / ED Course  I have reviewed the triage vital signs and the nursing notes.  Pertinent labs & imaging results that were available during my care of the patient were reviewed by me and considered in my medical decision making (see chart for details).     12:30 PM Patient feels much better at this time.  Heart rate improved.  Vital signs stable.  Labs without significant abnormality.  Influenza pending.  Patient would like to go home.  Discharged home with antinausea medication.  Repeat abdominal exam without tenderness.  Patient understands return to the emergency department for new or worsening symptoms  Final Clinical Impressions(s) / ED Diagnoses   Final diagnoses:  Nausea vomiting and diarrhea  Acute dehydration    ED Discharge Orders        Ordered    ondansetron (ZOFRAN ODT) 8 MG disintegrating tablet  Every 8 hours PRN     12/27/17 1224       Azalia Bilis, MD 12/27/17 1231

## 2017-12-27 NOTE — ED Triage Notes (Signed)
Fever, vomiting, diarrhea, HA, body aches, chills, cough x3 days.  Sts fever was up to 103.9 at home yesterday.  No meds taken for fever today and pt is afebrile at this time.

## 2018-12-26 ENCOUNTER — Encounter (HOSPITAL_BASED_OUTPATIENT_CLINIC_OR_DEPARTMENT_OTHER): Payer: Self-pay | Admitting: *Deleted

## 2018-12-26 ENCOUNTER — Other Ambulatory Visit: Payer: Self-pay

## 2018-12-26 ENCOUNTER — Emergency Department (HOSPITAL_BASED_OUTPATIENT_CLINIC_OR_DEPARTMENT_OTHER)
Admission: EM | Admit: 2018-12-26 | Discharge: 2018-12-27 | Disposition: A | Discharge status: Home / Self Care | Payer: Medicaid Other | Attending: Emergency Medicine | Admitting: Emergency Medicine

## 2018-12-26 DIAGNOSIS — R51 Headache: Secondary | ICD-10-CM | POA: Diagnosis present

## 2018-12-26 DIAGNOSIS — G4489 Other headache syndrome: Secondary | ICD-10-CM | POA: Insufficient documentation

## 2018-12-26 DIAGNOSIS — F17290 Nicotine dependence, other tobacco product, uncomplicated: Secondary | ICD-10-CM | POA: Diagnosis not present

## 2018-12-26 NOTE — ED Triage Notes (Signed)
Pt c/o h/a x 2 days 

## 2018-12-27 MED ORDER — KETOROLAC TROMETHAMINE 30 MG/ML IJ SOLN
30.0000 mg | Freq: Once | INTRAMUSCULAR | Status: AC
  Administered 2018-12-27: 30 mg via INTRAVENOUS
  Filled 2018-12-27: qty 1

## 2018-12-27 MED ORDER — SODIUM CHLORIDE 0.9 % IV BOLUS (SEPSIS)
1000.0000 mL | Freq: Once | INTRAVENOUS | Status: AC
  Administered 2018-12-27: 1000 mL via INTRAVENOUS

## 2018-12-27 MED ORDER — METOCLOPRAMIDE HCL 5 MG/ML IJ SOLN
10.0000 mg | Freq: Once | INTRAMUSCULAR | Status: AC
  Administered 2018-12-27: 10 mg via INTRAVENOUS
  Filled 2018-12-27: qty 2

## 2018-12-27 MED ORDER — DIPHENHYDRAMINE HCL 50 MG/ML IJ SOLN
25.0000 mg | Freq: Once | INTRAMUSCULAR | Status: AC
  Administered 2018-12-27: 25 mg via INTRAVENOUS
  Filled 2018-12-27: qty 1

## 2018-12-27 NOTE — Discharge Instructions (Addendum)

## 2018-12-27 NOTE — ED Notes (Signed)
Pt was given a sprite to drink. 

## 2018-12-27 NOTE — ED Provider Notes (Signed)
[facility] EMERGENCY DEPARTMENT Provider Note   CSN: [id number] Arrival date & time: 12/26/18  2345     History   Chief Complaint Chief Complaint  Patient presents with  . Headache    HPI Edgar Arellano is a 33 y.o. male.  The history is provided by the patient.  Headache  Pain location:  Frontal Quality:  Dull Onset quality:  Gradual Timing:  Constant Progression:  Worsening Chronicity:  New Similar to prior headaches: yes   Relieved by:  Nothing Worsened by:  Nothing Associated symptoms: photophobia   Associated symptoms: no abdominal pain, no blurred vision, no cough, no dizziness, no eye pain, no fever, no hearing loss, no visual change, no vomiting and no weakness    He Reports he has had a headache for 2 days.  He reports he woke up with a headache.  Tonight it got worse while he was at work.  No fevers or vomiting.  He has had similar headaches in the past, but this 1 has been lasting longer.  No head trauma.  No one else at home has the symptoms. Past Medical History:  Diagnosis Date  . Acid reflux     There are no active problems to display for this patient.   History reviewed. No pertinent surgical history.      Home Medications    Prior to Admission medications   Medication Sig Start Date End Date Taking? Authorizing Provider  docusate sodium (COLACE) 100 MG capsule Take 1 capsule (100 mg total) by mouth every 12 (twelve) hours. 07/29/17   Gilda Crease, MD  hydrocortisone (ANUSOL-HC) 25 MG suppository Place 1 suppository (25 mg total) rectally 2 (two) times daily. 07/29/17   Gilda Crease, MD  ondansetron (ZOFRAN ODT) 8 MG disintegrating tablet Take 1 tablet (8 mg total) by mouth every 8 (eight) hours as needed for nausea or vomiting. 12/27/17   Azalia Bilis, MD  polyethylene glycol [facility] / Ethelene Hal) packet Take 17 g by mouth daily as needed for moderate constipation. 07/29/17   Gilda Crease, MD    ranitidine (ZANTAC) 150 MG tablet Take 1 tablet (150 mg total) by mouth 2 (two) times daily. 07/29/17   Gilda Crease, MD    Family History History reviewed. No pertinent family history.  Social History Social History   Tobacco Use  . Smoking status: Current Some Day Smoker    Types: Cigars  . Smokeless tobacco: Never Used  Substance Use Topics  . Alcohol use: Yes    Comment: weekly  . Drug use: No     Allergies   Patient has no known allergies.   Review of Systems Review of Systems  Constitutional: Negative for fever.  HENT: Negative for hearing loss.   Eyes: Positive for photophobia. Negative for blurred vision, pain and visual disturbance.  Respiratory: Negative for cough.   Gastrointestinal: Negative for abdominal pain and vomiting.  Neurological: Positive for headaches. Negative for dizziness and weakness.  All other systems reviewed and are negative.    Physical Exam Updated Vital Signs BP 124/89   Pulse 68   Temp 98 F (36.7 C)   Resp 16   Ht 1.651 m (5\' 5" )   Wt 66.7 kg   SpO2 98%   BMI 24.46 kg/m   Physical Exam CONSTITUTIONAL: Well developed/well nourished HEAD: Normocephalic/atraumatic EYES: EOMI/PERRL, no nystagmus, no ptosis ENMT: Mucous membranes moist NECK: supple no meningeal signs, no bruits SPINE/BACK:entire spine nontender CV: S1/S2 noted, no murmurs/rubs/gallops noted  LUNGS: Lungs are clear to auscultation bilaterally, no apparent distress ABDOMEN: soft, nontender, no rebound or guarding GU:no cva tenderness NEURO:Awake/alert, face symmetric, no arm or leg drift is noted Equal 5/5 strength with shoulder abduction, elbow flex/extension, wrist flex/extension in upper extremities and equal hand grips bilaterally Equal 5/5 strength with hip flexion,knee flex/extension, foot dorsi/plantar flexion Cranial nerves 3/4/5/6/06/17/09/11/12 tested and intact Gait normal without ataxia No past pointing Sensation to light touch intact in  all extremities EXTREMITIES: pulses normal, full ROM SKIN: warm, color normal PSYCH: no abnormalities of mood noted, alert and oriented to situation     ED Treatments / Results  Labs (all labs ordered are listed, but only abnormal results are displayed) Labs Reviewed - No data to display  EKG None  Radiology No results found.  Procedures Procedures (including critical care time)  Medications Ordered in ED Medications  metoCLOPramide (REGLAN) injection 10 mg (10 mg Intravenous Given 12/27/18 0153)  diphenhydrAMINE (BENADRYL) injection 25 mg (25 mg Intravenous Given 12/27/18 0152)  ketorolac (TORADOL) 30 MG/ML injection 30 mg (30 mg Intravenous Given 12/27/18 0153)  sodium chloride 0.9 % bolus 1,000 mL (1,000 mLs Intravenous New Bag/Given 12/27/18 0254)     Initial Impression / Assessment and Plan / ED Course  I have reviewed the triage vital signs and the nursing notes.     Presents with headache for the past 1 to 2 days.  It ahs been gradual in onset.  There is no focal neuro deficits.  He is afebrile.  He is ambulatory.  Suspicion for acute neurologic emergency is low.  Will discharge home.  We discussed return precautions Final Clinical Impressions(s) / ED Diagnoses   Final diagnoses:  Other headache syndrome    ED Discharge Orders    None       Zadie Rhine, MD 12/27/18 [id number]

## 2018-12-27 NOTE — ED Notes (Signed)
Pt understood dc material. NAD noted. Doctors note given at [employer]

## 2019-10-19 IMAGING — CR DG CHEST 2V
2 series · 2 of 2 positions shown · non-contrast
Comparison: 07/28/2017

CLINICAL DATA: Fever, vomiting, diarrhea, headache and body aches.
Cough. Three days duration.

EXAM:
CHEST  2 VIEW

[w chest pa]
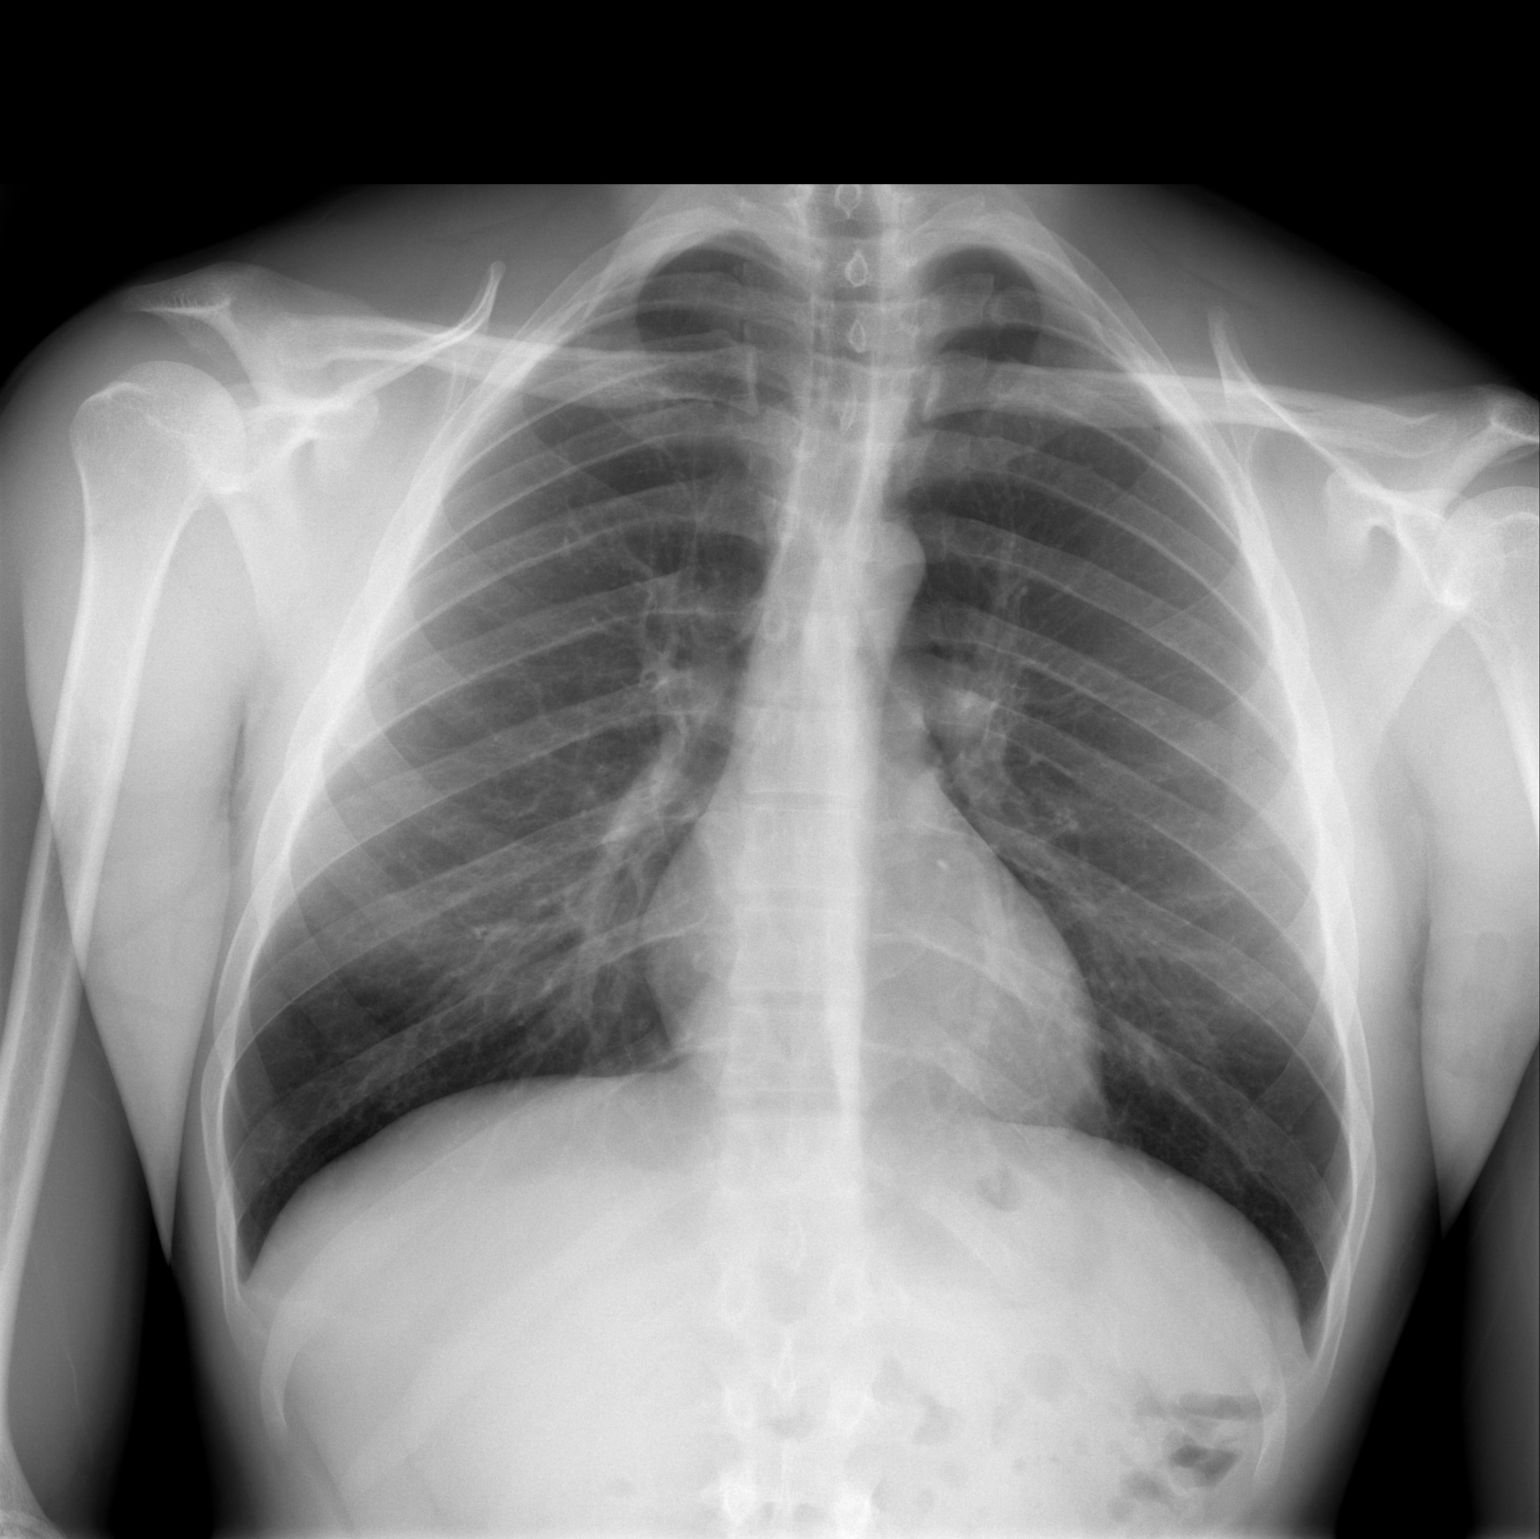

[w chest lat]
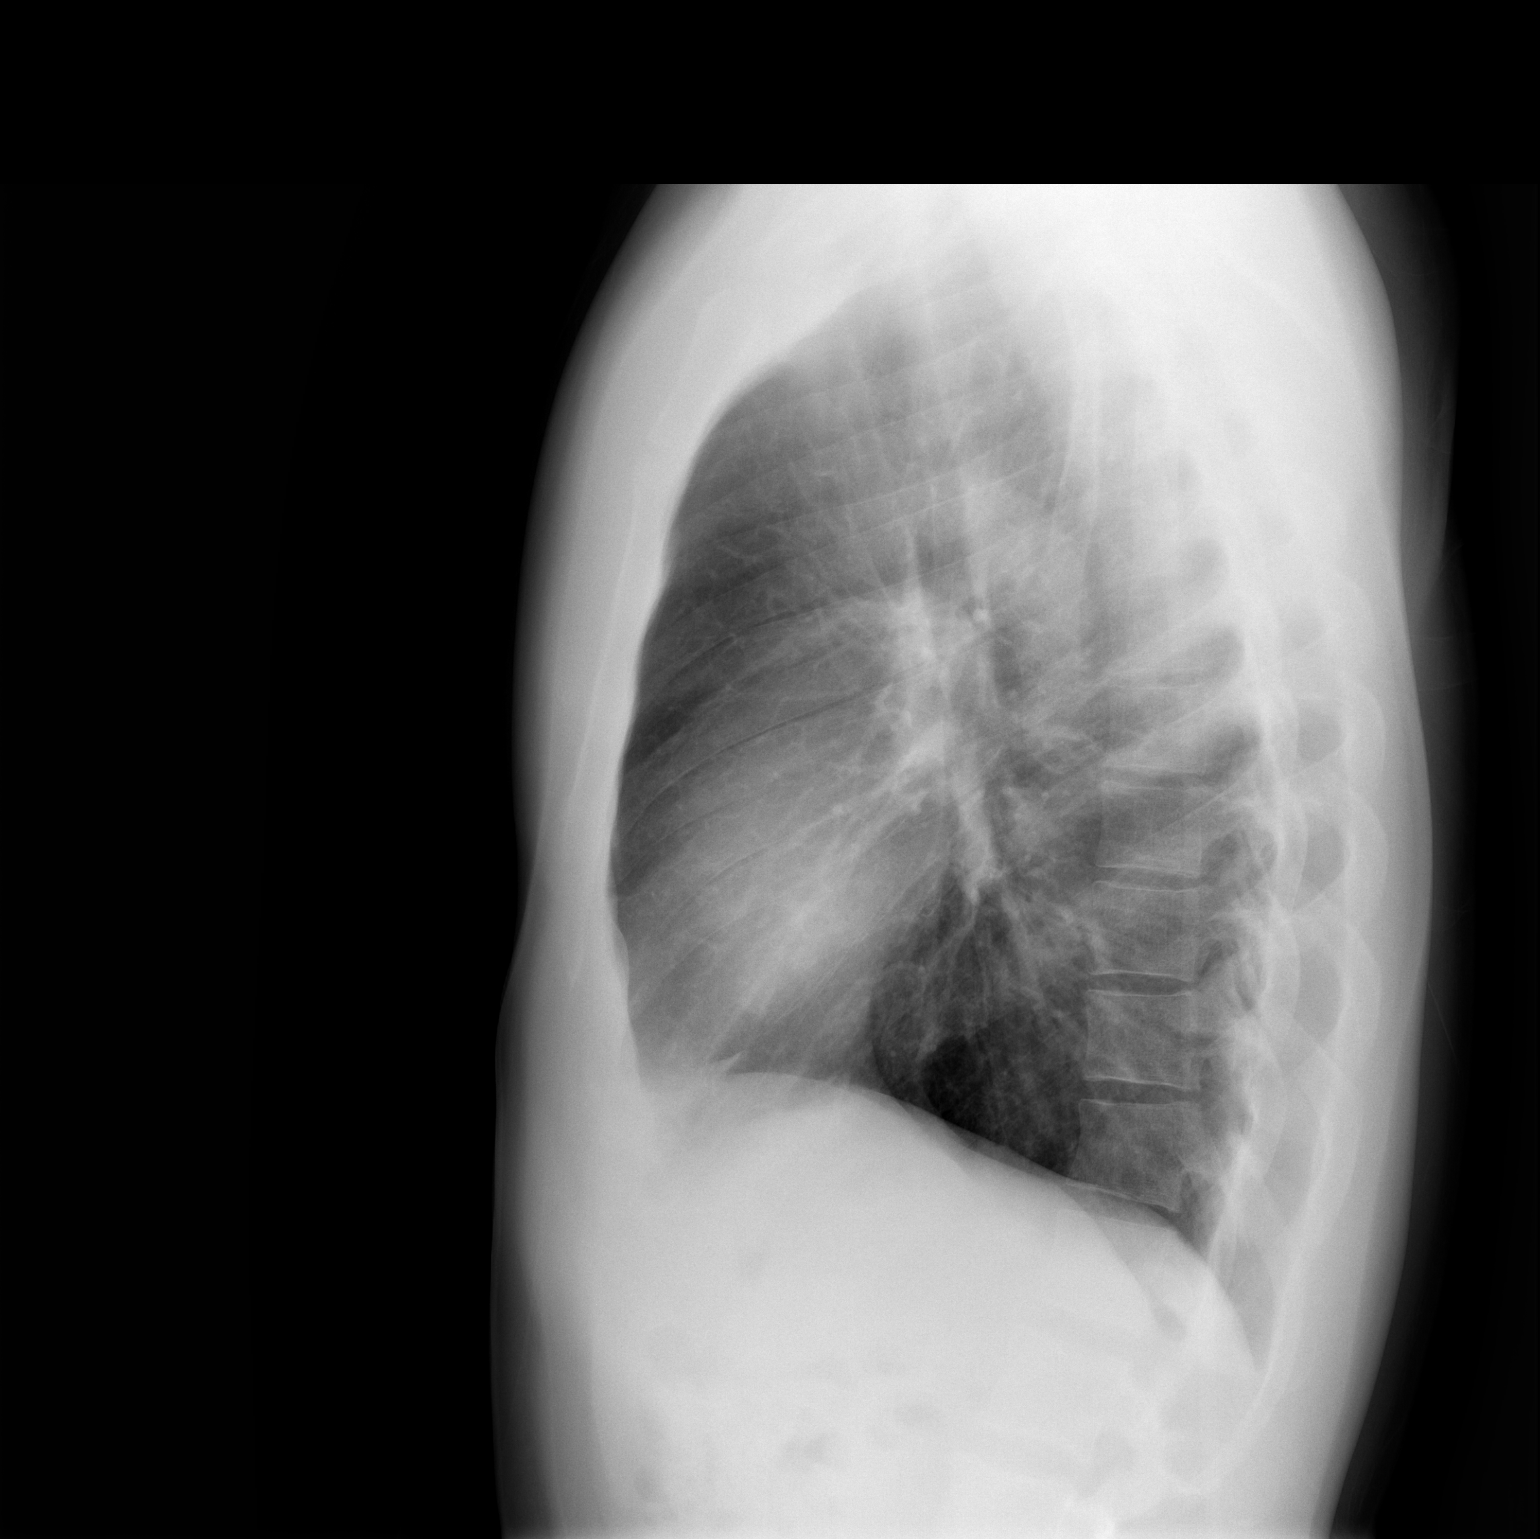

[2 of 2 positions shown; findings below may reference images not displayed]

FINDINGS: Heart size is normal. Mediastinal shadows are normal. The lungs are
clear. No bronchial thickening. No infiltrate, mass, effusion or
collapse. Pulmonary vascularity is normal. No bony abnormality.
IMPRESSION: Normal chest

## 2022-02-10 ENCOUNTER — Encounter (HOSPITAL_BASED_OUTPATIENT_CLINIC_OR_DEPARTMENT_OTHER): Payer: Self-pay | Admitting: Emergency Medicine

## 2022-02-10 ENCOUNTER — Emergency Department (HOSPITAL_BASED_OUTPATIENT_CLINIC_OR_DEPARTMENT_OTHER)
Admission: EM | Admit: 2022-02-10 | Discharge: 2022-02-10 | Disposition: A | Discharge status: Home / Self Care | Payer: Medicaid Other | Attending: Emergency Medicine | Admitting: Emergency Medicine

## 2022-02-10 ENCOUNTER — Emergency Department (HOSPITAL_BASED_OUTPATIENT_CLINIC_OR_DEPARTMENT_OTHER): Payer: Medicaid Other

## 2022-02-10 ENCOUNTER — Other Ambulatory Visit: Payer: Self-pay

## 2022-02-10 DIAGNOSIS — Z2914 Encounter for prophylactic rabies immune globin: Secondary | ICD-10-CM | POA: Diagnosis not present

## 2022-02-10 DIAGNOSIS — W540XXA Bitten by dog, initial encounter: Secondary | ICD-10-CM | POA: Insufficient documentation

## 2022-02-10 DIAGNOSIS — Z23 Encounter for immunization: Secondary | ICD-10-CM | POA: Diagnosis not present

## 2022-02-10 DIAGNOSIS — Y9389 Activity, other specified: Secondary | ICD-10-CM | POA: Insufficient documentation

## 2022-02-10 DIAGNOSIS — S61211A Laceration without foreign body of left index finger without damage to nail, initial encounter: Secondary | ICD-10-CM | POA: Insufficient documentation

## 2022-02-10 DIAGNOSIS — Z203 Contact with and (suspected) exposure to rabies: Secondary | ICD-10-CM | POA: Diagnosis not present

## 2022-02-10 DIAGNOSIS — S6992XA Unspecified injury of left wrist, hand and finger(s), initial encounter: Secondary | ICD-10-CM | POA: Diagnosis present

## 2022-02-10 MED ORDER — TETANUS-DIPHTH-ACELL PERTUSSIS 5-2.5-18.5 LF-MCG/0.5 IM SUSY
0.5000 mL | PREFILLED_SYRINGE | Freq: Once | INTRAMUSCULAR | Status: AC
  Administered 2022-02-10: 0.5 mL via INTRAMUSCULAR
  Filled 2022-02-10: qty 0.5

## 2022-02-10 MED ORDER — RABIES IMMUNE GLOBULIN 150 UNIT/ML IM INJ
20.0000 [IU]/kg | INJECTION | Freq: Once | INTRAMUSCULAR | Status: AC
  Administered 2022-02-10: 1425 [IU] via INTRAMUSCULAR
  Filled 2022-02-10: qty 10

## 2022-02-10 MED ORDER — RABIES VACCINE, PCEC IM SUSR
1.0000 mL | Freq: Once | INTRAMUSCULAR | Status: AC
  Administered 2022-02-10: 1 mL via INTRAMUSCULAR
  Filled 2022-02-10: qty 1

## 2022-02-10 MED ORDER — AMOXICILLIN-POT CLAVULANATE 875-125 MG PO TABS: 1.0000 | ORAL_TABLET | Freq: Two times a day (BID) | ORAL | 0 refills | Status: AC

## 2022-02-10 MED ORDER — AMOXICILLIN-POT CLAVULANATE 875-125 MG PO TABS
1.0000 | ORAL_TABLET | Freq: Once | ORAL | Status: AC
  Administered 2022-02-10: 1 via ORAL
  Filled 2022-02-10: qty 1

## 2022-02-10 NOTE — ED Triage Notes (Signed)
Reports he was playing with his friends dog when his teeth split his finger.  Reports the dog has not had his shots. ?

## 2022-02-10 NOTE — ED Provider Notes (Signed)
?[facility] EMERGENCY DEPARTMENT ?Provider Note ? ? ?CSN: [id number] ?Arrival date & time: 02/10/22  1942 ? ?  ? ?History ?Chief Complaint  ?Patient presents with  ? Animal Bite  ? ? ?Edgar Arellano is a 36 y.o. male presents emerged department for evaluation of animal bite to the left index finger earlier today.  The patient reports it was a friend's dog that is unknown if it was vaccinated.  He reports that he does not want to contact the friend to ask the state of the dogs rabies or other vaccines.  He reports he is not up-to-date on his tetanus he thinks it was more than 10 years ago.  He denies any numbness or tingling he reports he was able to get the bleeding controlled with applying a bandage.  He denies any other injuries.  He denies any medical or surgical history.  Denies any daily medications.  No known drug allergies.  Daily vaper, but denies any EtOH or illicit drug use. ? ? ?Animal Bite ? ?  ? ?Home Medications ?Prior to Admission medications   ?Medication Sig Start Date End Date Taking? Authorizing Provider  ?docusate sodium (COLACE) 100 MG capsule Take 1 capsule (100 mg total) by mouth every 12 (twelve) hours. 07/29/17   Gilda Crease, MD  ?hydrocortisone (ANUSOL-HC) 25 MG suppository Place 1 suppository (25 mg total) rectally 2 (two) times daily. 07/29/17   Gilda Crease, MD  ?ondansetron (ZOFRAN ODT) 8 MG disintegrating tablet Take 1 tablet (8 mg total) by mouth every 8 (eight) hours as needed for nausea or vomiting. 12/27/17   Azalia Bilis, MD  ?polyethylene glycol (MIRALAX / GLYCOLAX) packet Take 17 g by mouth daily as needed for moderate constipation. 07/29/17   Gilda Crease, MD  ?ranitidine (ZANTAC) 150 MG tablet Take 1 tablet (150 mg total) by mouth 2 (two) times daily. 07/29/17   Gilda Crease, MD  ?   ? ?Allergies    ?Patient has no known allergies.   ? ?Review of Systems   ?Review of Systems  ?Skin:  Positive for wound.  ? ? ?SEE  HPI ?Physical Exam ?Updated Vital Signs ?BP (!) 129/93 (BP Location: Right Arm)   Pulse 96   Temp 98.7 ?F (37.1 ?C) (Oral)   Resp 18   Ht 5\' 6"  (1.676 m)   Wt 70.8 kg   SpO2 100%   BMI 25.18 kg/m?  ?Physical Exam ?Constitutional:   ?   Appearance: Normal appearance.  ?Eyes:  ?   General: No scleral icterus. ?Pulmonary:  ?   Effort: Pulmonary effort is normal. No respiratory distress.  ?Skin: ?   General: Skin is dry.  ?   Findings: No rash.  ?   Comments: Small superficial laceration to the pad of the left index finger.  Bleeding is controlled.  No foreign body visualized but will obtain x-ray.  Compartments are soft.  Good cap refill.  No nailbed involvement.  Radial pulses intact.  Patient has full range of motion of his fingers and wrist.  ?Neurological:  ?   General: No focal deficit present.  ?   Mental Status: He is alert. Mental status is at baseline.  ?Psychiatric:     ?   Mood and Affect: Mood normal.  ? ? ? ?ED Results / Procedures / Treatments   ?Labs ?(all labs ordered are listed, but only abnormal results are displayed) ?Labs Reviewed - No data to display ? ?EKG ?None ? ?Radiology ?DG Finger  Index Left ? ?Result Date: 02/10/2022 ?CLINICAL DATA:  Dog bite. EXAM: LEFT INDEX FINGER 2+V COMPARISON:  None. FINDINGS: There is no evidence of fracture or dislocation. There is no evidence of arthropathy or other focal bone abnormality. Soft tissues are unremarkable. IMPRESSION: Negative. Electronically Signed   By: Darliss Cheney M.D.   On: 02/10/2022 21:32   ? ?Procedures ?Procedures  ? ?Medications Ordered in ED ?Medications  ?Tdap (BOOSTRIX) injection 0.5 mL (0.5 mLs Intramuscular Given 02/10/22 2137)  ?rabies vaccine (RABAVERT) injection 1 mL (1 mL Intramuscular Given 02/10/22 2154)  ?rabies immune globulin (HYPERAB/KEDRAB) injection 1,425 Units (1,425 Units Intramuscular Given 02/10/22 2155)  ? ? ?ED Course/ Medical Decision Making/ A&P ?  ?                        ?Medical Decision Making ?Amount and/or  Complexity of Data Reviewed ?Radiology: ordered. ? ?Risk ?Prescription drug management. ? ?36 year old male presents emergency department for evaluation of dog bite.  X-ray obtained to rule out any foreign body.  Wound was thoroughly cleaned by nursing staff. ? ?I independently reviewed and interpreted the patient's imaging.  There is no evidence of any underlying fracture or any foreign body or focal bone abnormality. ? ?The patient does not want to call the friend to ask about rabies vaccinations for the dog.  Discussed with patient that he will need to come back multiple times in the next 30 days for continued rabies vaccination shots.  The patient is okay with this.  We will also update tetanus while he is here. ? ?First dose of augmentin given here.  As the pharmacies are closed.  Prescription for Augmentin sent into his pharmacy.  Discussed the vaccine schedule with him and schedule provided to him in the discharge paperwork.  Recommended cleaning the wound twice daily and changing the dressing daily.  Return precautions discussed.  Patient agrees with plan.  Patient is stable being discharged home in good condition. ? ?Final Clinical Impression(s) / ED Diagnoses ?Final diagnoses:  ?Dog bite, initial encounter  ?Need for rabies vaccination  ? ? ?Rx / DC Orders ?ED Discharge Orders   ? ?      Ordered  ?  amoxicillin-clavulanate (AUGMENTIN) 875-125 MG tablet  Every 12 hours       ? 02/10/22 2236  ? ?  ?  ? ?  ? ? ?  ?Achille Rich, PA-C ?02/15/22 1028 ? ?  ?Melene Plan, DO ?02/15/22 1202 ? ?

## 2022-02-10 NOTE — Discharge Instructions (Addendum)
You were seen here today for evaluation of your finger after a dog bite. You were given your first dose of antibiotic while here. I have sent the rest of the prescription to the [facility] for you to pick up. You will need to take this medication twice a day for the next week to prevent infection. We gave you a tetanus vaccination here and started you on your rabies vaccinations as well. You will need to go to the [facility] for the remainder of your vaccinations. It is important to adhere to the vaccinations as rabies is not curable. We can not suture the finger closed as we do not want to close infection in your finger. Please keep the area clean with dial soap and warm water.  ? ?Contact a doctor if: ?You have more redness, swelling, or pain around your wound. ?Your wound feels warm to the touch. ?You have a fever or chills. ?You have a general feeling of sickness (malaise). ?You feel sick to your stomach (nauseous). ?You throw up (vomit). ?You have pain that does not get better. ? ?Get help right away if: ?You have a red streak going away from your wound. ?You have any of these coming from your wound: ?Non-clear fluid. ?More blood. ?Pus or a bad smell. ?You have trouble moving your injured area. ?You lose feeling (have numbness) or feel tingling anywhere on your body. ?

## 2023-12-03 IMAGING — DX DG FINGER INDEX 2+V*L*
3 series · 3 of 3 positions shown · non-contrast
Comparison: None.

CLINICAL DATA: Dog bite.

EXAM:
LEFT INDEX FINGER 2+V

[finger ap]
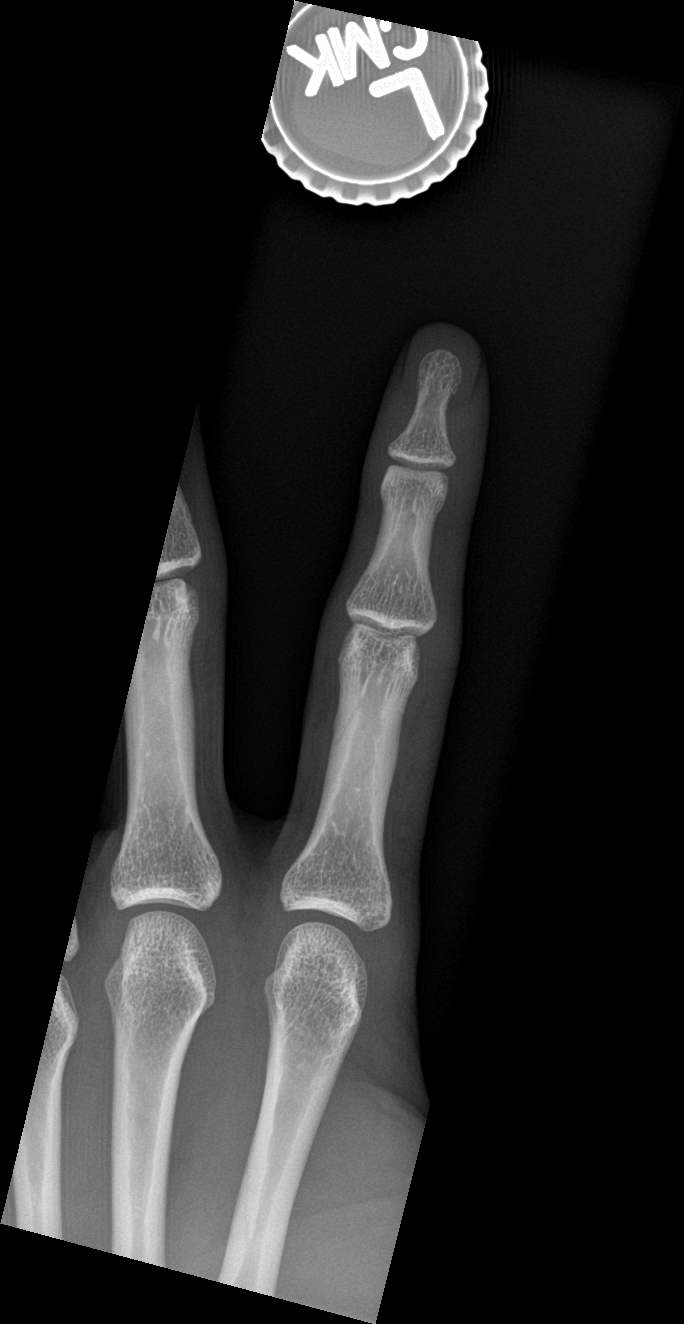

[finger obl]
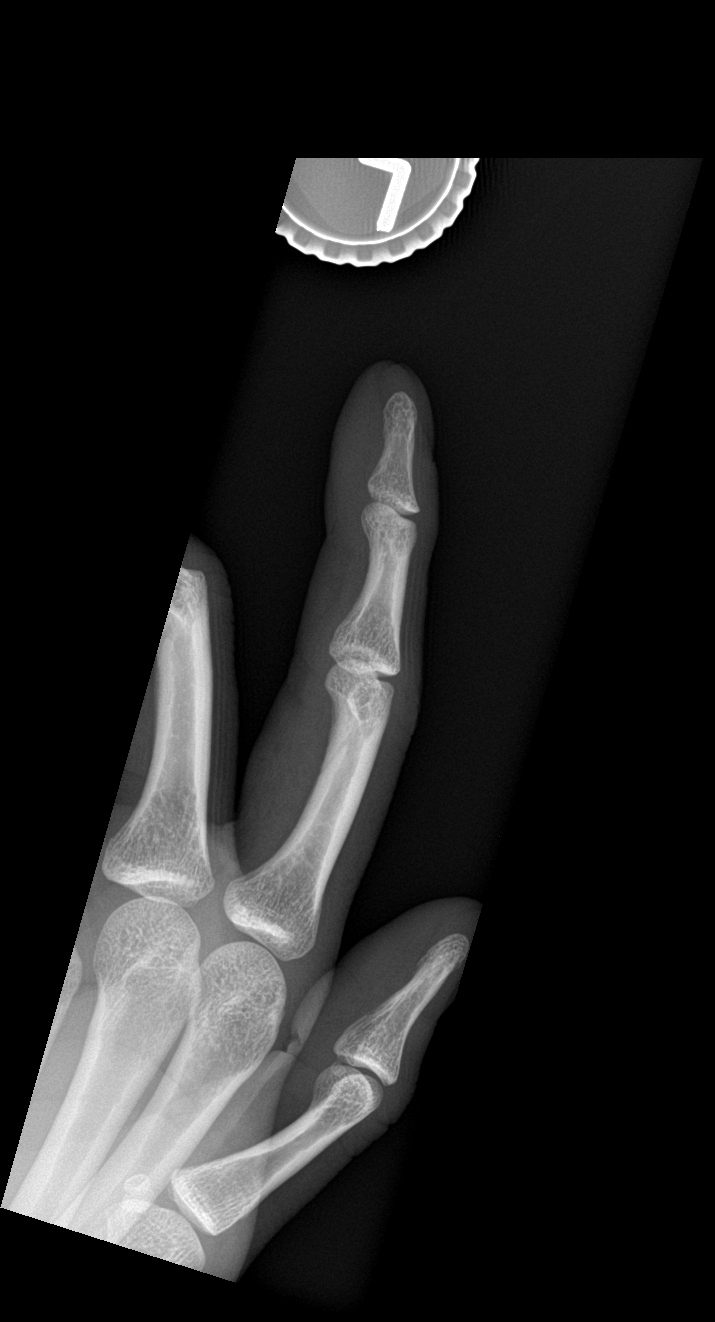

[finger lat]
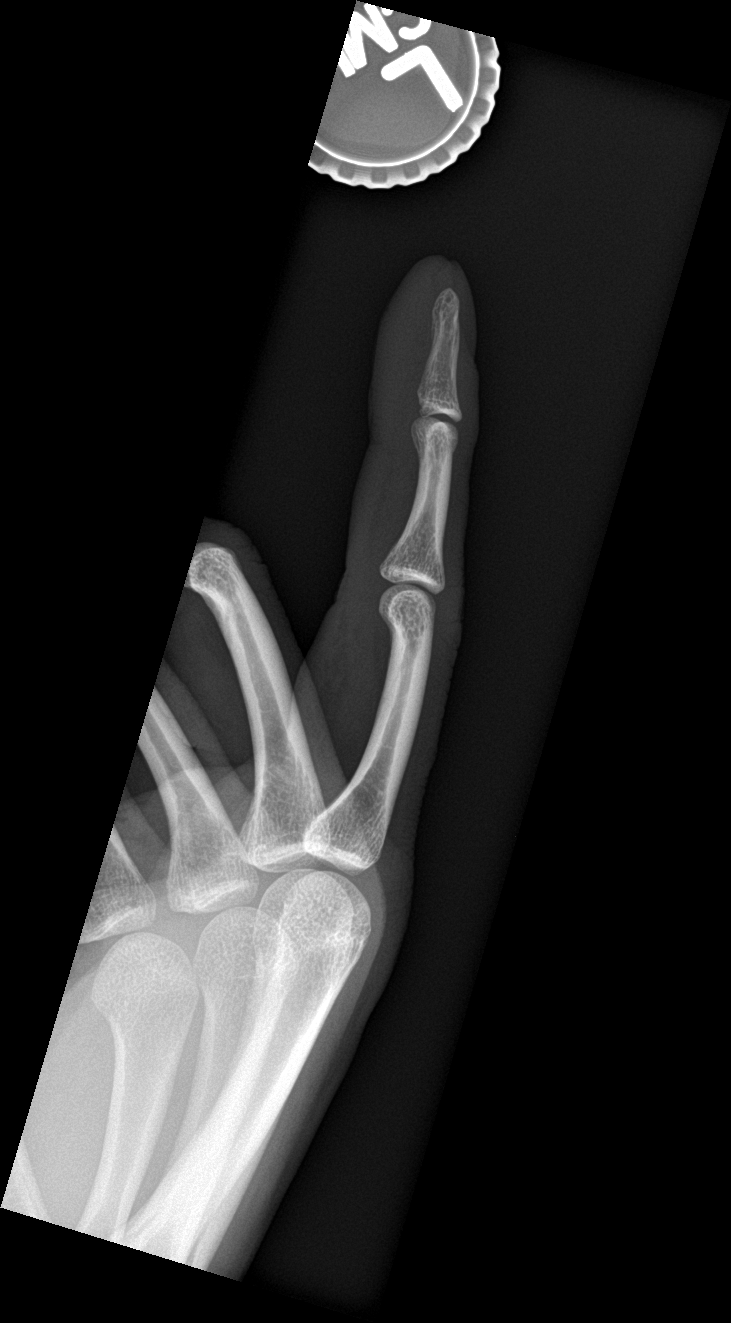

[3 of 3 positions shown; findings below may reference images not displayed]

FINDINGS: There is no evidence of fracture or dislocation. There is no
evidence of arthropathy or other focal bone abnormality. Soft
tissues are unremarkable.
IMPRESSION: Negative.
